# Patient Record
Sex: Male | Born: 1960 | Race: Black or African American | Hispanic: No | Marital: Single | Smoking: Current every day smoker
Health system: Southern US, Community
[De-identification: ages and names within clinical notes are randomized; demographics above are authoritative.]

## PROBLEM LIST (undated history)

## (undated) DIAGNOSIS — E785 Hyperlipidemia, unspecified: Secondary | ICD-10-CM

## (undated) DIAGNOSIS — J449 Chronic obstructive pulmonary disease, unspecified: Secondary | ICD-10-CM

## (undated) DIAGNOSIS — E119 Type 2 diabetes mellitus without complications: Secondary | ICD-10-CM

## (undated) HISTORY — DX: Chronic obstructive pulmonary disease, unspecified: J44.9

## (undated) HISTORY — DX: Type 2 diabetes mellitus without complications: E11.9

## (undated) HISTORY — DX: Hyperlipidemia, unspecified: E78.5

---

## 1998-09-10 ENCOUNTER — Ambulatory Visit (HOSPITAL_COMMUNITY): Admission: RE | Admit: 1998-09-10 | Discharge: 1998-09-10 | Payer: Self-pay | Admitting: Gastroenterology

## 1998-09-10 ENCOUNTER — Encounter: Payer: Self-pay | Admitting: Gastroenterology

## 1999-05-06 HISTORY — PX: HERNIA REPAIR: SHX51

## 1999-05-28 ENCOUNTER — Emergency Department (HOSPITAL_COMMUNITY): Admission: EM | Admit: 1999-05-28 | Discharge: 1999-05-28 | Payer: Self-pay | Admitting: Emergency Medicine

## 1999-05-28 ENCOUNTER — Encounter: Payer: Self-pay | Admitting: Emergency Medicine

## 1999-07-16 ENCOUNTER — Ambulatory Visit (HOSPITAL_COMMUNITY): Admission: RE | Admit: 1999-07-16 | Discharge: 1999-07-16 | Payer: Self-pay | Admitting: Gastroenterology

## 2000-01-10 ENCOUNTER — Emergency Department (HOSPITAL_COMMUNITY): Admission: EM | Admit: 2000-01-10 | Discharge: 2000-01-10 | Payer: Self-pay | Admitting: Emergency Medicine

## 2000-01-17 ENCOUNTER — Emergency Department (HOSPITAL_COMMUNITY): Admission: EM | Admit: 2000-01-17 | Discharge: 2000-01-17 | Payer: Self-pay | Admitting: Emergency Medicine

## 2000-01-24 ENCOUNTER — Ambulatory Visit (HOSPITAL_COMMUNITY): Admission: RE | Admit: 2000-01-24 | Discharge: 2000-01-24 | Payer: Self-pay | Admitting: *Deleted

## 2001-04-25 ENCOUNTER — Encounter: Payer: Self-pay | Admitting: Emergency Medicine

## 2001-04-25 ENCOUNTER — Emergency Department (HOSPITAL_COMMUNITY): Admission: EM | Admit: 2001-04-25 | Discharge: 2001-04-25 | Payer: Self-pay | Admitting: Emergency Medicine

## 2001-05-08 ENCOUNTER — Encounter: Payer: Self-pay | Admitting: Emergency Medicine

## 2001-05-08 ENCOUNTER — Emergency Department (HOSPITAL_COMMUNITY): Admission: EM | Admit: 2001-05-08 | Discharge: 2001-05-08 | Payer: Self-pay | Admitting: Emergency Medicine

## 2002-03-18 ENCOUNTER — Inpatient Hospital Stay (HOSPITAL_COMMUNITY): Admission: EM | Admit: 2002-03-18 | Discharge: 2002-03-22 | Payer: Self-pay | Admitting: Psychiatry

## 2003-07-21 ENCOUNTER — Emergency Department (HOSPITAL_COMMUNITY): Admission: EM | Admit: 2003-07-21 | Discharge: 2003-07-21 | Payer: Self-pay | Admitting: Emergency Medicine

## 2004-10-21 ENCOUNTER — Encounter: Admission: RE | Admit: 2004-10-21 | Discharge: 2004-10-21 | Payer: Self-pay | Admitting: Occupational Medicine

## 2004-10-21 IMAGING — CR DG CHEST 2V
2 series · 2 of 2 positions shown · non-contrast
Comparison: None. 
 The heart size is normal.  There is no heart failure, infiltrate or effusion.

CLINICAL DATA: Pre-employment physical.
 CHEST - 2 VIEW:

[view not recorded (1 of 2)]
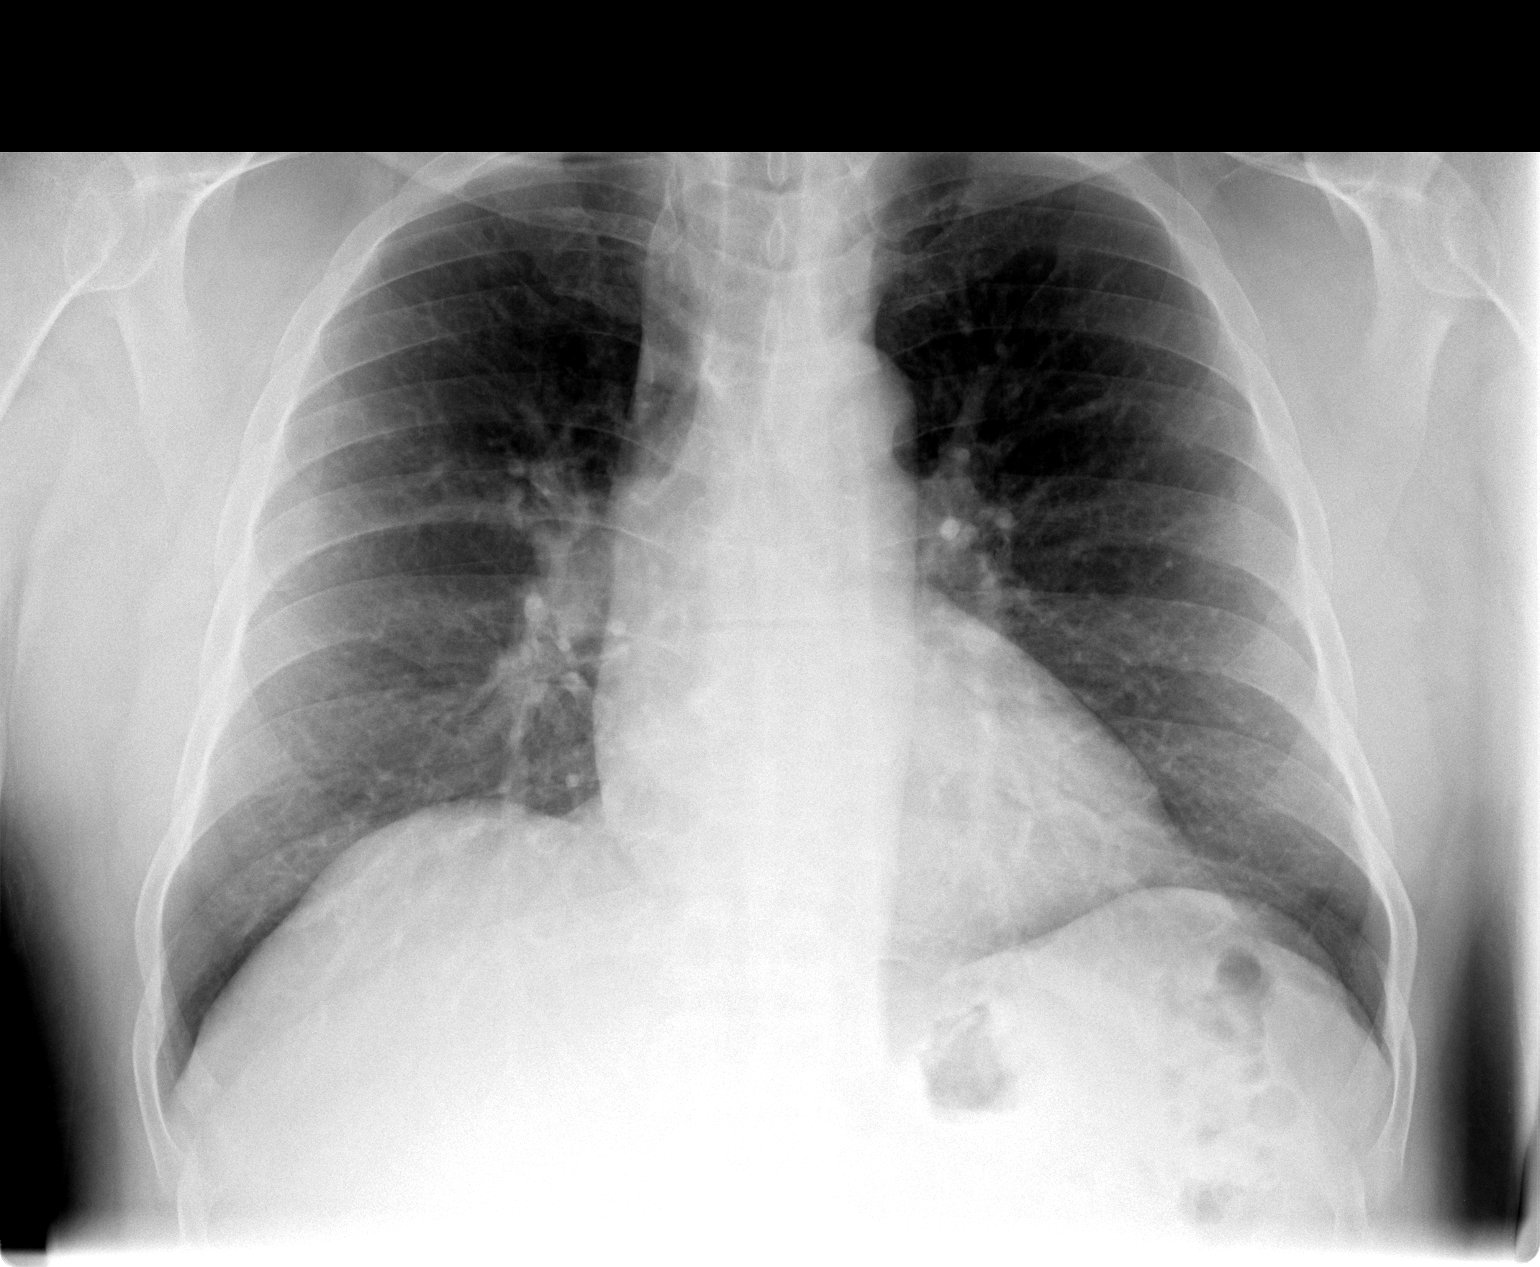

[view not recorded (2 of 2)]
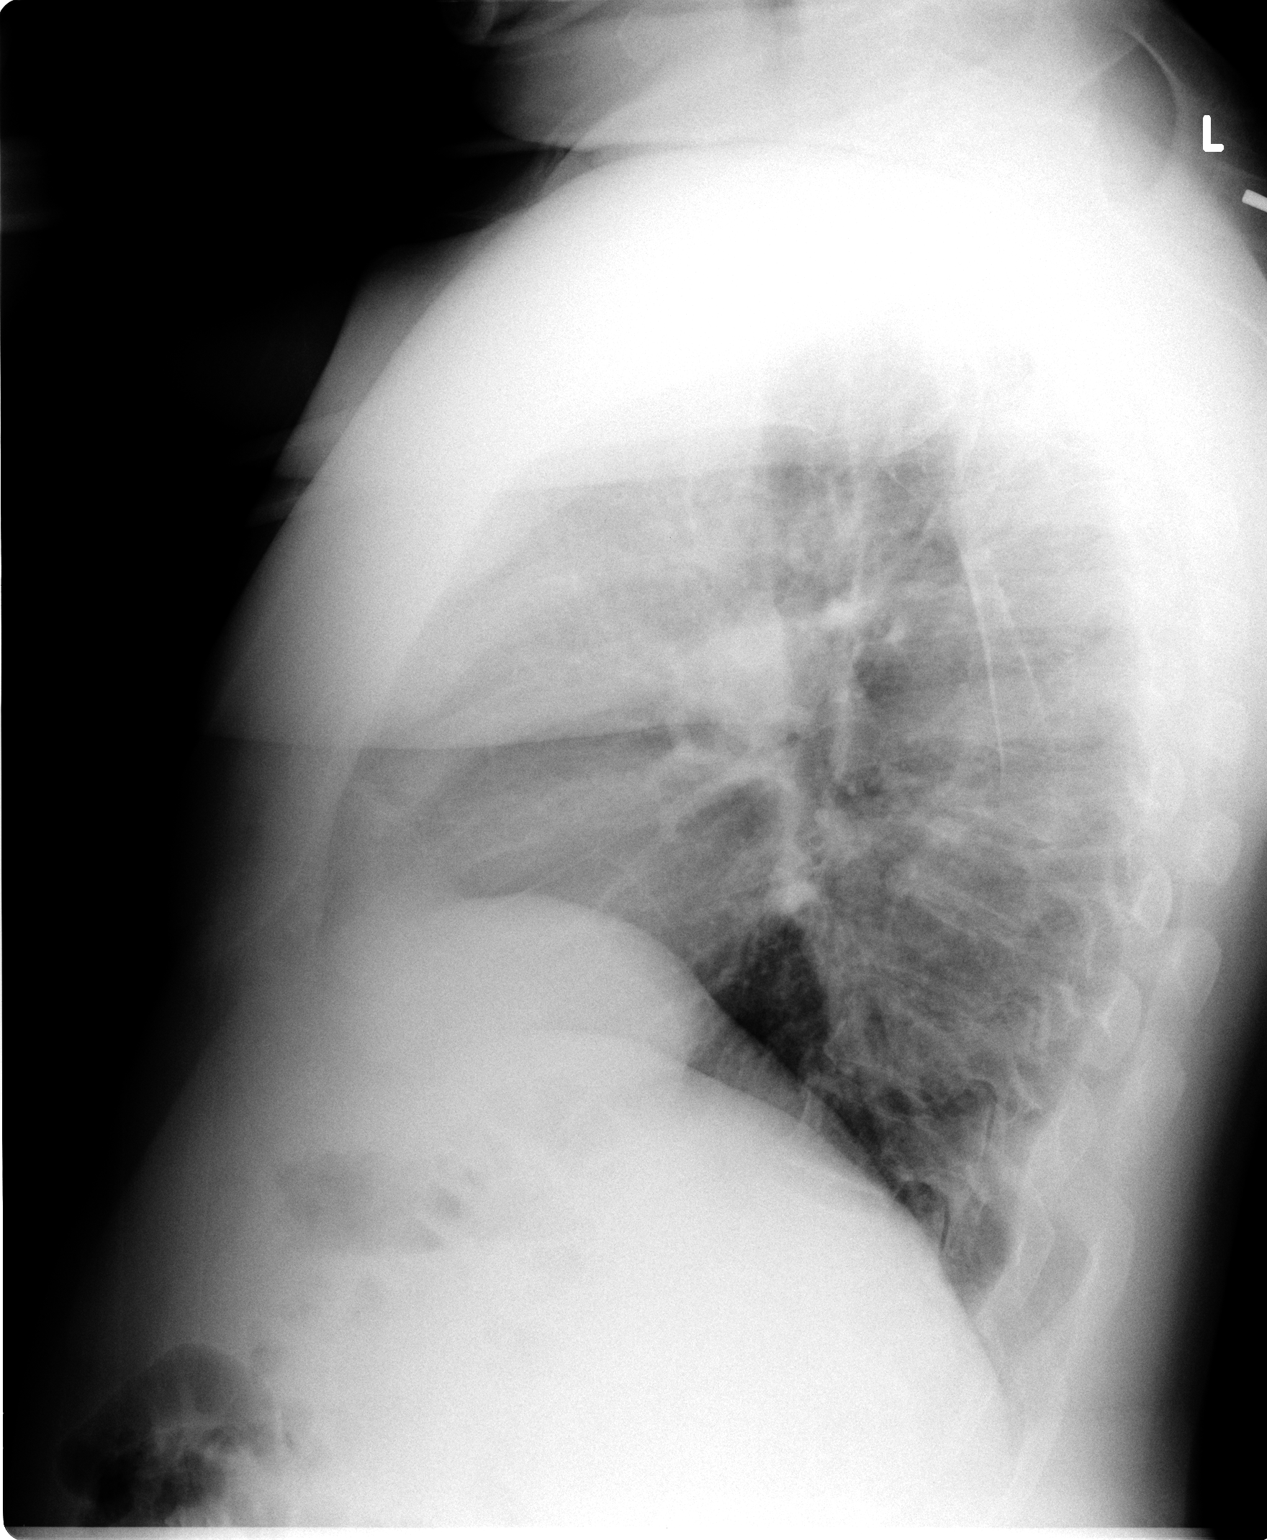

[2 of 2 positions shown; findings below may reference images not displayed]

IMPRESSION: No active disease.

## 2004-12-18 ENCOUNTER — Emergency Department (HOSPITAL_COMMUNITY): Admission: EM | Admit: 2004-12-18 | Discharge: 2004-12-18 | Payer: Self-pay | Admitting: Emergency Medicine

## 2006-11-14 ENCOUNTER — Emergency Department (HOSPITAL_COMMUNITY): Admission: EM | Admit: 2006-11-14 | Discharge: 2006-11-15 | Payer: Self-pay | Admitting: Emergency Medicine

## 2007-10-11 ENCOUNTER — Emergency Department (HOSPITAL_COMMUNITY): Admission: EM | Admit: 2007-10-11 | Discharge: 2007-10-11 | Payer: Self-pay | Admitting: Emergency Medicine

## 2008-09-20 ENCOUNTER — Emergency Department (HOSPITAL_COMMUNITY): Admission: EM | Admit: 2008-09-20 | Discharge: 2008-09-20 | Payer: Self-pay | Admitting: Emergency Medicine

## 2008-09-21 ENCOUNTER — Emergency Department (HOSPITAL_COMMUNITY): Admission: EM | Admit: 2008-09-21 | Discharge: 2008-09-21 | Payer: Self-pay | Admitting: Emergency Medicine

## 2010-08-13 LAB — POCT I-STAT, CHEM 8
BUN: 23 mg/dL (ref 6–23)
Calcium, Ion: 1.16 mmol/L (ref 1.12–1.32)
Chloride: 105 mEq/L (ref 96–112)
Creatinine, Ser: 1.1 mg/dL (ref 0.4–1.5)
Glucose, Bld: 111 mg/dL — ABNORMAL HIGH (ref 70–99)
HCT: 48 % (ref 39.0–52.0)
Hemoglobin: 16.3 g/dL (ref 13.0–17.0)
Potassium: 3 mEq/L — ABNORMAL LOW (ref 3.5–5.1)
Sodium: 139 mEq/L (ref 135–145)
TCO2: 26 mmol/L (ref 0–100)

## 2010-09-20 NOTE — Op Note (Signed)
[facility]  Patient:    Robert Strickland, Robert Strickland                        MRN: [id number] Proc. Date: 01/24/00 Adm. Date:  [id number] Attending:  Stephenie Acres                           Operative Report  PREOPERATIVE DIAGNOSIS:  Left inguinal hernia.  POSTOPERATIVE DIAGNOSIS:  Left inguinal hernia.  OPERATION:  Left inguinal hernia repair with mesh.  SURGEON:  Catalina Lunger, M.D.  ANESTHESIA:  General.  DESCRIPTION OF PROCEDURE:  The patient was taken to the operating room and placed in a supine position.  After adequate anesthesia was induced, the abdomen was prepped and draped in normal sterile fashion.  Using an oblique incision over the inguinal region, I dissected down to the external oblique fascia.  This was opened along its fibers.  Ilioinguinal nerve was retracted medially.  Spermatic cord was encircled with Penrose drain at the pubic tubercle.  Direct hernia sac was dissected off of the cord and reduced into the abdominal cavity.  The smaller exit incision was made in transversalis fascia.  The defect was closed by approximating transversalis fascia to the shelving edge of Coopers ligament using interrupted #1 Surgilons.  A piece of 3 x 6 inch onlay mesh was then placed over the repair, tacked medially to the pubic tubercle, superiorly to the transversalis fascia, inferiorly to Coopers ligament, split, and brought out around the cord.  A new internal ring was fashioned with the mesh.  It allowed only the tip of a Kelly clamp next to it. The tail of the mesh was also tacked down.  The fascia was then closed with a running 3-0 Vicryl.  All tissues were injected using Marcaine.  Skin was closed with staples.  The patient tolerated the procedure well and went to PACU in good condition. DD:  01/24/00 TD:  01/25/00 Job: [id number]

## 2010-09-20 NOTE — H&P (Signed)
NAME:  Robert Strickland, Robert Strickland                           ACCOUNT NO.:  [id number]   MEDICAL RECORD NO.:  [id number]                   PATIENT TYPE:  IPS   LOCATION:  0302                                 FACILITY:  [facility]   PHYSICIAN:  Jeanice Lim, M.D.              DATE OF BIRTH:  April 24, 1961   DATE OF ADMISSION:  03/18/2002  DATE OF DISCHARGE:                         PSYCHIATRIC ADMISSION ASSESSMENT   IDENTIFYING INFORMATION:  The patient is a 50 year old single African-  American male voluntarily admitted on March 18, 2002.   HISTORY OF PRESENT ILLNESS:  The patient presents with a history of alcohol  and drug use.  The patient states he needs help.  He feels very guilty over  relapsing on alcohol and drugs and experiencing evil spirits telling him to  hurt himself and, at that time, it was imperative that he get help.  He has  been having stressors with his past girlfriend and her children.  He states  he is unable to help them.  Children were disrespectful.  Girlfriend's  daughter had gotten herself pregnant.  He denies any current suicidal or  homicidal ideation or psychosis.  He is motivated to get his life back  together.  He feels very depressed but no suicidal ideation.  States his  sleep and appetite have been satisfactory.   PAST PSYCHIATRIC HISTORY:  First hospitalization to [facility].  No other psychiatric admissions.  No outpatient treatment.  He was  detoxed at [facility] in March of 2003.  His longest history of sobriety has been  five months.  No suicide attempts.   SOCIAL HISTORY:  This is a 50 year old single African-American male with two  children, ages 70 and 70.  Children are with their mother.  He works at  [employer].  No legal problems.  Completed the 10th grade.   FAMILY HISTORY:  States his son has ADD.   ALCOHOL/DRUG HISTORY:  The patient smokes.  He has been drinking four 40-  ounce beers per day.  States his drinking has been escalating.  He  drinks  sometimes in the morning.  No blackouts.  No history of seizures.  Last  drink was on March 17, 2002.  Also has been using crack cocaine for the  past two years with last use on Thursday prior to this admission.   PRIMARY CARE PHYSICIAN:  Dr. Randa Evens in [place].   MEDICAL PROBLEMS:  Hepatitis C was diagnosed in 1999.  He states he took  experimental treatment and has not had any problems since.   MEDICATIONS:  None.   ALLERGIES:  No known allergies.   PHYSICAL EXAMINATION:  Performed at [facility].  The  patient appears without any complaints.  He is well-nourished.   LABORATORY DATA:  Hemoglobin 19, hematocrit 57.  CMET is within normal  limits.  Troponin level is 0.02.  Urine drug screen was positive  for  cocaine.  Alcohol level less than 5.   MENTAL STATUS EXAM:  He is an alert, oriented, casually dressed male with  fair eye contact.  Speech is fair.  Mood is guilty, depressed.  Affect is  flat and tearful.  Thought processes are endorsing visual hallucinations  prior to this admission.  No auditory hallucinations.  No paranoia.  No  suicidal or homicidal ideation.  Cognitive function intact.  Memory is fair.  Judgment and insight are fair.  Poor impulse control.   DIAGNOSES:   AXIS I:  1. Major depression, severe.  2. Alcohol abuse.  3. Cocaine abuse.   AXIS II:  Deferred.   AXIS III:  None.   AXIS IV:  Problems with primary support group and other psychosocial  problems.   AXIS V:  Current 25; this past year 38.   PLAN:  Voluntary admission for depression, suicidal ideation and alcohol  dependence.  Contract for safety.  Check every 15 minutes.  Will initiate a  low dose Librium to detox safely.  Have Symmetrel for cravings.  Motrin will  be available for pain.  Will initiate Celexa for depressive symptoms.  Increase coping skills by attending groups.  Remain alcohol and drug-free.  Follow up with mental health.  To be  medication-compliant, which was  discussed with the patient.   TENTATIVE LENGTH OF STAY:  Four to five days.     Landry Corporal, N.P.                       Jeanice Lim, M.D.    JO/MEDQ  D:  03/22/2002  T:  03/22/2002  Job:  [id number]

## 2010-09-20 NOTE — Discharge Summary (Signed)
NAME:  Robert Strickland, Robert Strickland                           ACCOUNT NO.:  [id number]   MEDICAL RECORD NO.:  [id number]                   PATIENT TYPE:  IPS   LOCATION:  0302                                 [facility]ACILITY[facility]  [facility]   PHYSICIAN:  Jeanice Lim, M.D.              DATE OF BIRTH:  January 18, 1961   DATE OF ADMISSION:  03/18/2002  DATE OF DISCHARGE:  03/22/2002                                 DISCHARGE SUMMARY   IDENTIFYING DATA:  This is a 50 year old African-American male, voluntarily  admitted, presenting with a history of alcohol and drug use, feeling guilty  over relapsing on alcohol and drugs.  Experiencing evil spirits telling him  to hurt himself.  His girlfriend's daughter had become pregnant and he is  motivated to get his life back together.  Patient had be detoxed at [facility] in  the past.   MEDICATIONS:  None.   ALLERGIES:  No known drug allergies.   PHYSICAL EXAMINATION:  Performed [place].  Essentially within normal  limits.  Neurologically nonfocal.   ROUTINE ADMISSION LABORATORIES:  Hematology panel within normal limits.  CBC  within normal limits.  Troponin level 0.02, urine drug screen positive for  cocaine, alcohol level less than 5.   MENTAL STATUS EXAM:  Alert, oriented, casually dressed male, fair eye  contact.  Speech clear.  Mood guilty, depressed.  Affect flat, tearful.  Thought processes goal directed.  Reporting visual hallucinations prior to  admission.  No paranoia.  No dangerous ideation.  Cognitively intact.  Judgment and insight limited.   ADMISSION DIAGNOSES:   AXIS I:  1. Major depression, severe.  2. Alcohol dependency.  3. Cocaine abuse.   AXIS II:  None.   AXIS III:  None.   AXIS IV:  Moderate problems primarily support group and other psychosocial  issues.   AXIS V:  25/65   HOSPITAL COURSE:  Patient was admitted with routine p.o. medications.  Was  started on Symmetrel for cocaine cravings, Celexa targeting depressive  symptoms, placed on  a low dose Librium protocol for safe withdrawal with  monitoring.  Seroquel is added to restore sleep and target agitation.  Aftercare planning was done.  A family meeting was held.  Patient reported a  positive response to clinical intervention.   CONDITION ON DISCHARGE:  Improved.  Mood was more euthymic.  Affect  brighter.  Thought processes goal directed.  Thought content negative for  dangerous ideation and psychotic symptoms.  Patient denied any acute  withdrawal symptoms or cravings.   DISCHARGE MEDICATIONS:  Patient was discharged on:  1. Celexa 20 mg q.a.m.  2. Seroquel 200 mg q.h.s.  3. Ambien 10 mg q.h.s. p.r.n.  4. Symmetrel 100 mg b.i.d.   FOLLOW UP:  Patient was to follow up at [facility]  on 11/20 at 7 PM.   DISCHARGE DIAGNOSES:   AXIS I:  1. Major  depression, severe.  2. Alcohol dependency.  3. Cocaine abuse.   AXIS II:  None.   AXIS III:  None.   AXIS IV:  Moderate problems primarily support group and other psychosocial  issues.   AXIS V:  GAF on discharge was 55.                                                  Jeanice Lim, M.D.    JEM/MEDQ  D:  03/30/2002  T:  03/31/2002  Job:  [id number]

## 2010-09-20 NOTE — Procedures (Signed)
[place]  Patient:    Robert Strickland, Robert Strickland                        MRN: [id number] Proc. Date: 07/16/99 Adm. Date:  [id number] Attending:  Orland Mustard CC:         Moshe Salisbury. Audria Nine, M.D. at [facility]                           Procedure Report  PROCEDURE:  Colonoscopy.  ENDOSCOPIST:  Llana Aliment. Edwards, M.D.  MEDICATIONS:  Fentanyl 75 mcg and Versed 6 mg IV.  SCOPE:  Olympus adult video colonoscope.  INDICATIONS:  Rectal bleeding.  DESCRIPTION OF PROCEDURE:  The patient had been explained to the patient and consent obtained.  With the patient in the left lateral decubitus position, the  Olympus adult video colonoscope was inserted and advanced under direct visualization.  _________ and were able to advance to the cecum without difficulty.  The scope withdrawn.  The cecum, ascending colon, hepatic flexure,  transverse colon, splenic flexure, and descending colon were seen well.  The sigmoid was also seen well with no polyps.  No diverticular disease.  Internal hemorrhoids seen in the rectum upon removal of the scope.  The scope was withdrawn. The patient tolerated the procedure well.  ASSESSMENT:  Large internal hemorrhoids, otherwise negative colonoscopy.  PLAN:  Will give hemorrhoid sheet, fiber supplement, and see back in my office n two or three months to discuss his hepatitis C further. DD:  07/16/99 TD:  07/16/99 Job: [id number]

## 2011-01-31 DIAGNOSIS — Z800 Family history of malignant neoplasm of digestive organs: Secondary | ICD-10-CM | POA: Insufficient documentation

## 2011-02-05 DIAGNOSIS — N529 Male erectile dysfunction, unspecified: Secondary | ICD-10-CM | POA: Insufficient documentation

## 2011-02-18 LAB — BASIC METABOLIC PANEL
CO2: 25
Calcium: 9.1
Creatinine, Ser: 0.9
GFR calc Af Amer: >60
GFR calc non Af Amer: >60
Sodium: 138

## 2011-02-18 LAB — DIFFERENTIAL
Basophils Relative: 0
Lymphocytes Relative: 44
Lymphs Abs: 2.9
Monocytes Absolute: 0.5
Monocytes Relative: 8
Neutro Abs: 2.9
Neutrophils Relative %: 45

## 2011-02-18 LAB — CBC
Hemoglobin: 14.7
MCHC: 34.9
RBC: 4.72
WBC: 6.5

## 2015-11-14 DIAGNOSIS — I10 Essential (primary) hypertension: Secondary | ICD-10-CM | POA: Insufficient documentation

## 2016-06-06 DIAGNOSIS — E1122 Type 2 diabetes mellitus with diabetic chronic kidney disease: Secondary | ICD-10-CM | POA: Insufficient documentation

## 2016-08-29 DIAGNOSIS — B182 Chronic viral hepatitis C: Secondary | ICD-10-CM | POA: Insufficient documentation

## 2016-09-18 DIAGNOSIS — G8929 Other chronic pain: Secondary | ICD-10-CM | POA: Insufficient documentation

## 2017-08-19 DIAGNOSIS — E669 Obesity, unspecified: Secondary | ICD-10-CM | POA: Insufficient documentation

## 2017-08-19 DIAGNOSIS — Z720 Tobacco use: Secondary | ICD-10-CM | POA: Insufficient documentation

## 2017-08-19 DIAGNOSIS — R7303 Prediabetes: Secondary | ICD-10-CM | POA: Insufficient documentation

## 2017-09-21 DIAGNOSIS — E782 Mixed hyperlipidemia: Secondary | ICD-10-CM | POA: Insufficient documentation

## 2018-12-31 DIAGNOSIS — N182 Chronic kidney disease, stage 2 (mild): Secondary | ICD-10-CM | POA: Insufficient documentation

## 2019-03-15 DIAGNOSIS — J410 Simple chronic bronchitis: Secondary | ICD-10-CM | POA: Insufficient documentation

## 2020-09-18 ENCOUNTER — Other Ambulatory Visit (HOSPITAL_BASED_OUTPATIENT_CLINIC_OR_DEPARTMENT_OTHER): Payer: Self-pay | Admitting: Family Medicine

## 2020-09-18 DIAGNOSIS — J449 Chronic obstructive pulmonary disease, unspecified: Secondary | ICD-10-CM

## 2020-10-02 ENCOUNTER — Ambulatory Visit (HOSPITAL_BASED_OUTPATIENT_CLINIC_OR_DEPARTMENT_OTHER): Payer: 59

## 2020-10-08 ENCOUNTER — Ambulatory Visit (HOSPITAL_BASED_OUTPATIENT_CLINIC_OR_DEPARTMENT_OTHER): Payer: 59

## 2020-10-22 ENCOUNTER — Ambulatory Visit (HOSPITAL_BASED_OUTPATIENT_CLINIC_OR_DEPARTMENT_OTHER): Payer: 59

## 2021-05-13 ENCOUNTER — Ambulatory Visit (INDEPENDENT_AMBULATORY_CARE_PROVIDER_SITE_OTHER): Payer: 59 | Admitting: Urology

## 2021-05-13 ENCOUNTER — Other Ambulatory Visit: Payer: Self-pay

## 2021-05-13 VITALS — BP 133/79 | HR 99 | Ht 65.0 in | Wt 266.0 lb

## 2021-05-13 DIAGNOSIS — R310 Gross hematuria: Secondary | ICD-10-CM | POA: Diagnosis not present

## 2021-05-13 NOTE — Progress Notes (Signed)
05/13/2021 3:02 PM   Robert Strickland 13-May-1960 [id number]  Referring provider: Si Gaul, [place],  [place] [id number]  Chief Complaint  Patient presents with   Hematuria    HPI: Robert Strickland is a 61 y.o. male referred for evaluation of hematuria  Presented to ED [place] at [facility] 04/20/2021 with onset of gross hematuria the day of presentation. Urine described as dark red without clots No flank, abdominal or pelvic pain Does have baseline voiding symptoms including frequency, urgency, urinary hesitancy and a weak urinary stream CT abdomen pelvis with/without contrast was performed which showed a 14 mm hypodense lesion of the right adrenal gland consistent with a small adenoma.  No renal calculi, hydronephrosis or renal mass identified.  There was a large right inguinal hernia containing a portion of the bladder. Hematuria has resolved No anticoagulant medication  PMH:  Diabetes mellitus ([place])   Family history of colon cancer 01/31/2011   Hypercholesterolemia   Hypertension   Surgical History:  HERNIA REPAIR   Home Medications:  Allergies as of 05/13/2021       Reactions   Varenicline Other (See Comments)   Vivid dreams        Medication List        Accurate as of May 13, 2021  3:02 PM. If you have any questions, ask your nurse or doctor.          albuterol 108 (90 Base) MCG/ACT inhaler Commonly known as: VENTOLIN HFA INHALE 2 PUFFS INTO THE LUNGS EVERY 4 HOURS AS NEEDED FOR WHEEZING   buPROPion 150 MG 12 hr tablet Commonly known as: WELLBUTRIN SR TAKE 1 TABLET(150 MG) BY MOUTH DAILY FOR 3 DAYS THEN TAKE 1 TABLET(150 MG) BY MOUTH IN THE MORNING AND AT BEDTIME FOR 27 DAYS   cyclobenzaprine 10 MG tablet Commonly known as: FLEXERIL TAKE 1 TABLET(10 MG) BY MOUTH AT BEDTIME AS NEEDED   lisinopril 40 MG tablet Commonly known as: ZESTRIL Take 40 mg by mouth daily.   metFORMIN 500 MG  tablet Commonly known as: GLUCOPHAGE TAKE 1 TABLET(500 MG) BY MOUTH DAILY WITH BREAKFAST   Rybelsus 3 MG Tabs Generic drug: Semaglutide Take 1 tablet by mouth every morning.   sildenafil 20 MG tablet Commonly known as: REVATIO TAKE 1 TO 3 TABLETS DAILY AS NEEDED PRIOR TO SEXUAL INTERCOURSE        Allergies:  Allergies  Allergen Reactions   Varenicline Other (See Comments)    Vivid dreams    Family History: No family history on file.  Social History:  has no history on file for tobacco use, alcohol use, and drug use.   Physical Exam: BP 133/79    Pulse 99    Ht 5\' 5"  (1.651 m)    Wt 266 lb (120.7 kg)    BMI 44.26 kg/m   Constitutional:  Alert and oriented, No acute distress. HEENT: [place] AT, moist mucus membranes.  Trachea midline, no masses. Cardiovascular: No clubbing, cyanosis, or edema. Respiratory: Normal respiratory effort, no increased work of breathing. GI: Large right inguinal hernia GU: Unable to palpate prostate secondary to body habitus and discomfort Skin: No rashes, bruises or suspicious lesions. Neurologic: Grossly intact, no focal deficits, moving all 4 extremities. Psychiatric: Normal mood and affect.  Laboratory Data:  Urinalysis Negative rbc  Pertinent Imaging: CT images were unable to be reviewed   Assessment & Plan:    1. Gross hematuria AUA hematuria risk stratification:  High CT urogram was unremarkable He does have a right inguinal hernia containing a portion of bowel We discussed cystoscopy as the recommended lower tract evaluation for hematuria The procedure was discussed in detail and he did request Rx for Valium preprocedure.  He was informed he would need a driver.   2.  Right inguinal hernia Containing a portion of bowel Recommend general surgery evaluation    Abbie Sons, MD  [place] [address], [place], [place] [address] [phone]

## 2021-05-15 ENCOUNTER — Encounter: Payer: Self-pay | Admitting: Urology

## 2021-05-15 MED ORDER — DIAZEPAM 10 MG PO TABS: ORAL_TABLET | ORAL | 0 refills | Status: DC

## 2021-05-16 LAB — MICROSCOPIC EXAMINATION
Bacteria, UA: NONE SEEN
RBC, Urine: NONE SEEN /hpf (ref 0–2)

## 2021-05-16 LAB — URINALYSIS, COMPLETE
Bilirubin, UA: NEGATIVE
Glucose, UA: NEGATIVE
Ketones, UA: NEGATIVE
Leukocytes,UA: NEGATIVE
Nitrite, UA: NEGATIVE
Protein,UA: NEGATIVE
RBC, UA: NEGATIVE
Specific Gravity, UA: 1.015 (ref 1.005–1.030)
Urobilinogen, Ur: 0.2 mg/dL (ref 0.2–1.0)
pH, UA: 7 (ref 5.0–7.5)

## 2021-06-13 ENCOUNTER — Other Ambulatory Visit: Payer: Self-pay

## 2021-06-13 ENCOUNTER — Ambulatory Visit (INDEPENDENT_AMBULATORY_CARE_PROVIDER_SITE_OTHER): Payer: Managed Care, Other (non HMO) | Admitting: Urology

## 2021-06-13 VITALS — BP 114/75 | HR 97 | Ht 65.0 in | Wt 260.0 lb

## 2021-06-13 DIAGNOSIS — R310 Gross hematuria: Secondary | ICD-10-CM

## 2021-06-13 LAB — URINALYSIS, COMPLETE
Bilirubin, UA: NEGATIVE
Glucose, UA: NEGATIVE
Ketones, UA: NEGATIVE
Leukocytes,UA: NEGATIVE
Nitrite, UA: NEGATIVE
Protein,UA: NEGATIVE
RBC, UA: NEGATIVE
Specific Gravity, UA: 1.025 (ref 1.005–1.030)
Urobilinogen, Ur: 0.2 mg/dL (ref 0.2–1.0)
pH, UA: 5.5 (ref 5.0–7.5)

## 2021-06-13 LAB — MICROSCOPIC EXAMINATION
Bacteria, UA: NONE SEEN
RBC: NONE SEEN /hpf (ref 0–2)

## 2021-06-13 NOTE — Progress Notes (Signed)
° °  06/13/21  CC:  Chief Complaint  Patient presents with   Cysto    HPI: Seen 05/13/2021 after an episode of gross hematuria.  CT urogram showed no upper tract abnormalities.  He did have a large right inguinal hernia containing a portion of the bladder.  Denies recurrent hematuria.  UA today shows no abnormalities  See rooming tab for vitals NED. A&Ox3.   No respiratory distress   Abd soft, NT, ND Normal phallus with bilateral descended testicles  Cystoscopy Procedure Note  Patient identification was confirmed, informed consent was obtained, and patient was prepped using Betadine solution.  Lidocaine jelly was administered per urethral meatus.     Pre-Procedure: - Inspection reveals a normal caliber urethral meatus.  Procedure: The flexible cystoscope was introduced without difficulty - No urethral strictures/lesions are present. -  Prominent lateral lobe enlargement with hypervascularity/friability   - Elevated bladder neck - Bilateral ureteral orifices identified - Bladder mucosa  reveals no ulcers, tumors, or lesions.  The portion of the bladder contained within the right inguinal hernia was easily examined with the cystoscope and no mucosal abnormalities noted - No bladder stones - No trabeculation  Retroflexion shows backbleeding from the prostate with no gross abnormalities   Post-Procedure: - Patient tolerated the procedure well  Assessment/ Plan: Gross hematuria-resolved.  Most likely prostatic in origin No mucosal abnormalities on cystoscopy Prominent hypervascularity and friability prostatic urethra He does have a large hernia containing a portion of the bladder.  His PCP in [facility] has placed a surgery consult and they will contact that office regarding status    Abbie Sons, MD

## 2021-06-14 ENCOUNTER — Encounter: Payer: Self-pay | Admitting: Urology

## 2022-02-17 ENCOUNTER — Ambulatory Visit: Payer: 59 | Admitting: Internal Medicine

## 2022-05-09 ENCOUNTER — Ambulatory Visit: Payer: Self-pay | Admitting: Medical

## 2022-05-27 ENCOUNTER — Other Ambulatory Visit: Payer: Self-pay

## 2022-05-27 ENCOUNTER — Ambulatory Visit: Payer: 59 | Admitting: Medical

## 2022-05-27 VITALS — BP 120/70 | HR 90 | Resp 18 | Ht 65.0 in | Wt 284.6 lb

## 2022-05-27 DIAGNOSIS — Z6841 Body Mass Index (BMI) 40.0 and over, adult: Secondary | ICD-10-CM

## 2022-05-27 DIAGNOSIS — I10 Essential (primary) hypertension: Secondary | ICD-10-CM

## 2022-05-27 DIAGNOSIS — Z23 Encounter for immunization: Secondary | ICD-10-CM

## 2022-05-27 DIAGNOSIS — J4540 Moderate persistent asthma, uncomplicated: Secondary | ICD-10-CM

## 2022-05-27 DIAGNOSIS — N529 Male erectile dysfunction, unspecified: Secondary | ICD-10-CM

## 2022-05-27 DIAGNOSIS — E1169 Type 2 diabetes mellitus with other specified complication: Secondary | ICD-10-CM | POA: Diagnosis not present

## 2022-05-27 DIAGNOSIS — E119 Type 2 diabetes mellitus without complications: Secondary | ICD-10-CM

## 2022-05-27 DIAGNOSIS — E785 Hyperlipidemia, unspecified: Secondary | ICD-10-CM

## 2022-05-27 DIAGNOSIS — F17200 Nicotine dependence, unspecified, uncomplicated: Secondary | ICD-10-CM | POA: Diagnosis not present

## 2022-05-27 DIAGNOSIS — Z1211 Encounter for screening for malignant neoplasm of colon: Secondary | ICD-10-CM

## 2022-05-27 MED ORDER — BUPROPION HCL ER (XL) 150 MG PO TB24: 150.0000 mg | ORAL_TABLET | Freq: Every day | ORAL | 3 refills | Status: AC

## 2022-05-27 MED ORDER — BUDESONIDE-FORMOTEROL FUMARATE 160-4.5 MCG/ACT IN AERO: 2.0000 | INHALATION_SPRAY | Freq: Two times a day (BID) | RESPIRATORY_TRACT | 12 refills | Status: AC

## 2022-05-27 MED ORDER — BUDESONIDE-FORMOTEROL FUMARATE 160-4.5 MCG/ACT IN AERO: 2.0000 | INHALATION_SPRAY | Freq: Two times a day (BID) | RESPIRATORY_TRACT | 12 refills | Status: DC

## 2022-05-27 MED ORDER — SILDENAFIL CITRATE 100 MG PO TABS: 50.0000 mg | ORAL_TABLET | Freq: Every day | ORAL | 3 refills | Status: AC | PRN

## 2022-05-27 NOTE — Progress Notes (Signed)
Subjective:    Patient ID: Robert Strickland, male    DOB: [birth year], 62 y.o.   MRN: [id number]  HPI   Pt in for first time.  Pt works Programmer, systems. Works for [employer]. Pt was walking regularly until it got cold. States eats salad at lunch. Smoker, no alcohol use.    Pt is diabetic- last a1 was 6.3 months ago. He is only on meftormin. He is on 500 mg once a day.   Smoker- smoked 45 years 1/2 pack a day. When used wellbutrin in past smoked less.   Obesity- bmi of 47.36. No hx of pancreatitis. No fh of thyromedullary cancer.  Obesity.   Htn- bp controlled. Lisinopril 40 mg daily.  Hx of asthma- he has albuterol. At work. He uses 1-2 times daily. He has symbicort in the past but ran out.  ED- pt use revatio. He gets that at [facility]. No history of chest pain. No nitro use.   Pt states he is due to have colonoscopy-  was due in December but lost insurance.    Review of Systems  Constitutional:  Negative for chills, fatigue and fever.  Respiratory:  Negative for cough, chest tightness, shortness of breath and wheezing.   Cardiovascular:  Negative for chest pain and palpitations.  Gastrointestinal:  Negative for abdominal pain.  Genitourinary:  Negative for dysuria and flank pain.  Musculoskeletal:  Negative for back pain.  Skin:  Negative for rash.  Neurological:  Negative for dizziness, speech difficulty, weakness, numbness and headaches.  Hematological:  Negative for adenopathy. Does not bruise/bleed easily.  Psychiatric/Behavioral:  Negative for behavioral problems and decreased concentration.     No past medical history on file.   Social History   Socioeconomic History   Marital status: Single    Spouse name: Not on file   Number of children: Not on file   Years of education: Not on file   Highest education level: Not on file  Occupational History   Not on file  Tobacco Use   Smoking status: Not on file   Smokeless tobacco: Not on file  Substance and Sexual  Activity   Alcohol use: Not on file   Drug use: Not on file   Sexual activity: Not on file  Other Topics Concern   Not on file  Social History Narrative   Not on file   Social Determinants of Health   Financial Resource Strain: Not on file  Food Insecurity: Not on file  Transportation Needs: Not on file  Physical Activity: Not on file  Stress: Not on file  Social Connections: Not on file  Intimate Partner Violence: Not on file    No past surgical history on file.  No family history on file.  Allergies  Allergen Reactions   Bee Venom Swelling    Possibly anaphylaxis per pt Possibly anaphylaxis per pt    Varenicline Other (See Comments)    Vivid dreams    Current Outpatient Medications on File Prior to Visit  Medication Sig Dispense Refill   albuterol (VENTOLIN HFA) 108 (90 Base) MCG/ACT inhaler INHALE 2 PUFFS INTO THE LUNGS EVERY 4 HOURS AS NEEDED FOR WHEEZING     buPROPion (WELLBUTRIN SR) 150 MG 12 hr tablet TAKE 1 TABLET(150 MG) BY MOUTH DAILY FOR 3 DAYS THEN TAKE 1 TABLET(150 MG) BY MOUTH IN THE MORNING AND AT BEDTIME FOR 27 DAYS     cyclobenzaprine (FLEXERIL) 10 MG tablet TAKE 1 TABLET(10 MG) BY MOUTH AT BEDTIME  AS NEEDED     diazepam (VALIUM) 10 MG tablet 1 tab po 30 min prior to procedure 1 tablet 0   lisinopril (ZESTRIL) 40 MG tablet Take 40 mg by mouth daily.     metFORMIN (GLUCOPHAGE) 500 MG tablet TAKE 1 TABLET(500 MG) BY MOUTH DAILY WITH BREAKFAST     sildenafil (REVATIO) 20 MG tablet TAKE 1 TO 3 TABLETS DAILY AS NEEDED PRIOR TO SEXUAL INTERCOURSE     diclofenac (VOLTAREN) 75 MG EC tablet Take by mouth 2 (two) times daily.     No current facility-administered medications on file prior to visit.    BP 120/70   Pulse 90   Resp 18   Ht 5\' 5"  (1.651 m)   Wt 284 lb 9.6 oz (129.1 kg)   SpO2 98%   BMI 47.36 kg/m        Objective:   Physical Exam  General Mental Status- Alert. General Appearance- Not in acute distress.   Skin General: Color- Normal  Color. Moisture- Normal Moisture.  Neck Carotid Arteries- Normal color. Moisture- Normal Moisture. No carotid bruits. No JVD.  Chest and Lung Exam Auscultation: Breath Sounds:-Normal.  Cardiovascular Auscultation:Rythm- Regular. Murmurs & Other Heart Sounds:Auscultation of the heart reveals- No Murmurs.  Abdomen Inspection:-Inspeection Normal. Palpation/Percussion:Note:No mass. Palpation and Percussion of the abdomen reveal- Non Tender, Non Distended + BS, no rebound or guarding.   Neurologic Cranial Nerve exam:- CN III-XII intact(No nystagmus), symmetric smile. Strength:- 5/5 equal and symmetric strength both upper and lower extremities.       Assessment & Plan:   Patient Instructions  1. Need for influenza vaccination  - Flu Vaccine QUAD 6+ mos PF IM (Fluarix Quad PF)  2. Encounter for screening colonoscopy Recommend call gi office number which is on summary - Ambulatory referral to Gastroenterology  3. Diabetes mellitus without complication ([place]) Continue metformin and low sugar diet.  - Comp Met (CMET); Future - Hemoglobin A1c; Future  4. Class 3 severe obesity without serious comorbidity with body mass index (BMI) of 45.0 to 49.9 in adult, unspecified obesity type ([place]) After review of labs might consider med called ozempic. But you express doubt on coverage cost.  5. Hypertension, unspecified type Lisinopril 40 mg daily.  . 6. Smoker Rx wellbutrin 150 xl daily.  7. Moderate persistent asthma without complication  For asthma use symbicort 2 inhalation twice daily. Use albuterol inhaler 2 inhalation every 4-6 hours sob or wheezing.  8. Hyperlipidemia associated with type 2 diabetes mellitus ([place]) For high cholesterol rx atorvastatin. - Lipid panel; Future   9. For ED rx viagra. Sent to [place].   Future cmp, lipid and A1c to be done fasting. Please get put on lab schedule.  Follow up one month or sooner if needed.     Mackie Pai, PA-C

## 2022-05-27 NOTE — Patient Instructions (Addendum)
1. Need for influenza vaccination  - Flu Vaccine QUAD 6+ mos PF IM (Fluarix Quad PF)  2. Encounter for screening colonoscopy Recommend call gi office number which is on summary - Ambulatory referral to Gastroenterology  3. Diabetes mellitus without complication ([place]) Continue metformin and low sugar diet.  - Comp Met (CMET); Future - Hemoglobin A1c; Future  4. Class 3 severe obesity without serious comorbidity with body mass index (BMI) of 45.0 to 49.9 in adult, unspecified obesity type ([place]) After review of labs might consider med called ozempic. But you express doubt on coverage cost.  5. Hypertension, unspecified type Lisinopril 40 mg daily.  . 6. Smoker Rx wellbutrin 150 xl daily.  7. Moderate persistent asthma without complication  For asthma use symbicort 2 inhalation twice daily. Use albuterol inhaler 2 inhalation every 4-6 hours sob or wheezing.  8. Hyperlipidemia associated with type 2 diabetes mellitus ([employer]) For high cholesterol rx atorvastatin. - Lipid panel; Future   9. For ED rx viagra. Sent to [place].   Future cmp, lipid and A1c to be done fasting. Please get put on lab schedule.  Follow up one month or sooner if needed.

## 2022-05-27 NOTE — Addendum Note (Signed)
Addended by: Anabel Halon on: 05/27/2022 03:51 PM   Modules accepted: Orders

## 2022-05-30 ENCOUNTER — Encounter: Payer: Self-pay | Admitting: Gastroenterology

## 2022-06-06 ENCOUNTER — Ambulatory Visit: Payer: Self-pay | Admitting: Medical

## 2022-06-10 ENCOUNTER — Encounter: Payer: Self-pay | Admitting: Gastroenterology

## 2022-06-10 ENCOUNTER — Ambulatory Visit (AMBULATORY_SURGERY_CENTER): Payer: 59 | Admitting: *Deleted

## 2022-06-10 VITALS — Ht 65.0 in | Wt 267.0 lb

## 2022-06-10 DIAGNOSIS — Z1211 Encounter for screening for malignant neoplasm of colon: Secondary | ICD-10-CM

## 2022-06-10 DIAGNOSIS — Z800 Family history of malignant neoplasm of digestive organs: Secondary | ICD-10-CM

## 2022-06-10 MED ORDER — PEG 3350-KCL-NA BICARB-NACL 420 G PO SOLR: 4000.0000 mL | Freq: Once | ORAL | 0 refills | Status: AC

## 2022-06-10 NOTE — Progress Notes (Signed)
No egg or soy allergy known to patient  No issues known to pt with past sedation with any surgeries or procedures Patient denies ever being told they had issues or difficulty with intubation  No FH of Malignant Hyperthermia Pt is not on diet pills Pt is not on  home 02  Pt is not on blood thinners  Pt denies issues with constipation  Pt is not on dialysis Pt denies any upcoming cardiac testing Pt encouraged to use to use Singlecare or Goodrx to reduce cost  Patient's chart reviewed by John Nulty CNRA prior to previsit and patient appropriate for the [facility].  Previsit completed and red dot placed by patient's name on their procedure day (on provider's schedule).  . Visit by phone Instructions reviewed with pt and pt states understanding. Instructed to review again prior to procedure. Pt states they will.  Instructions sent by mail 

## 2022-06-16 ENCOUNTER — Other Ambulatory Visit: Payer: 59

## 2022-07-02 ENCOUNTER — Encounter: Payer: 59 | Admitting: Gastroenterology

## 2022-07-04 ENCOUNTER — Ambulatory Visit (AMBULATORY_SURGERY_CENTER): Payer: 59

## 2022-07-04 VITALS — Ht 65.0 in | Wt 270.0 lb

## 2022-07-04 DIAGNOSIS — Z800 Family history of malignant neoplasm of digestive organs: Secondary | ICD-10-CM

## 2022-07-04 MED ORDER — PEG 3350-KCL-NA BICARB-NACL 420 G PO SOLR: 4000.0000 mL | Freq: Once | ORAL | 0 refills | Status: AC

## 2022-07-04 NOTE — Progress Notes (Signed)
No egg or soy allergy known to patient   No issues known to pt with past sedation with any surgeries or procedures  Patient denies ever being told they had issues or difficulty with intubation   No FH of Malignant Hyperthermia  Pt is not on diet pills  Pt is not on  home 02   Pt is not on blood thinners   Pt denies issues with constipation   No A fib or A flutter  Have any cardiac testing pending--no Pt instructed to use Singlecare.com or GoodRx for a price reduction on prep   

## 2022-07-09 ENCOUNTER — Encounter: Payer: Self-pay | Admitting: Gastroenterology

## 2022-07-24 ENCOUNTER — Encounter: Payer: 59 | Admitting: Gastroenterology

## 2022-07-28 ENCOUNTER — Ambulatory Visit: Payer: 59 | Admitting: Internal Medicine

## 2022-08-10 ENCOUNTER — Encounter: Payer: Self-pay | Admitting: Certified Registered Nurse Anesthetist

## 2022-08-11 ENCOUNTER — Ambulatory Visit (AMBULATORY_SURGERY_CENTER): Payer: 59 | Admitting: Gastroenterology

## 2022-08-11 ENCOUNTER — Encounter: Payer: Self-pay | Admitting: Gastroenterology

## 2022-08-11 VITALS — BP 111/80 | HR 91 | Temp 98.0°F | Resp 25 | Ht 65.0 in | Wt 270.0 lb

## 2022-08-11 DIAGNOSIS — Z800 Family history of malignant neoplasm of digestive organs: Secondary | ICD-10-CM | POA: Diagnosis not present

## 2022-08-11 DIAGNOSIS — Z1211 Encounter for screening for malignant neoplasm of colon: Secondary | ICD-10-CM

## 2022-08-11 MED ORDER — SODIUM CHLORIDE 0.9 % IV SOLN
500.0000 mL | Freq: Once | INTRAVENOUS | Status: DC
Start: 2022-08-11 — End: 2022-08-11

## 2022-08-11 NOTE — Patient Instructions (Signed)
**  Handout given on Diverticulosis and Hemorrhoids**   YOU HAD AN ENDOSCOPIC PROCEDURE TODAY AT THE [facility]:   Refer to the procedure report that was given to you for any specific questions about what was found during the examination.  If the procedure report does not answer your questions, please call your gastroenterologist to clarify.  If you requested that your care partner not be given the details of your procedure findings, then the procedure report has been included in a sealed envelope for you to review at your convenience later.  YOU SHOULD EXPECT: Some feelings of bloating in the abdomen. Passage of more gas than usual.  Walking can help get rid of the air that was put into your GI tract during the procedure and reduce the bloating. If you had a lower endoscopy (such as a colonoscopy or flexible sigmoidoscopy) you may notice spotting of blood in your stool or on the toilet paper. If you underwent a bowel prep for your procedure, you may not have a normal bowel movement for a few days.  Please Note:  You might notice some irritation and congestion in your nose or some drainage.  This is from the oxygen used during your procedure.  There is no need for concern and it should clear up in a day or so.  SYMPTOMS TO REPORT IMMEDIATELY:  Following lower endoscopy (colonoscopy or flexible sigmoidoscopy):  Excessive amounts of blood in the stool  Significant tenderness or worsening of abdominal pains  Swelling of the abdomen that is new, acute  Fever of 100F or higher   For urgent or emergent issues, a gastroenterologist can be reached at any hour by calling [phone]. Do not use MyChart messaging for urgent concerns.    DIET:  We do recommend a small meal at first, but then you may proceed to your regular diet.  Drink plenty of fluids but you should avoid alcoholic beverages for 24 hours.  ACTIVITY:  You should plan to take it easy for the rest of today and you should  NOT DRIVE or use heavy machinery until tomorrow (because of the sedation medicines used during the test).    FOLLOW UP:  Repeat colonoscopy in 1 year with Golytely prep.  Our staff will call the number listed on your records the next business day following your procedure.  We will call around 7:15- 8:00 am to check on you and address any questions or concerns that you may have regarding the information given to you following your procedure. If we do not reach you, we will leave a message.     If any biopsies were taken you will be contacted by phone or by letter within the next 1-3 weeks.  Please call us at [phone] if you have not heard about the biopsies in 3 weeks.    SIGNATURES/CONFIDENTIALITY: You and/or your care partner have signed paperwork which will be entered into your electronic medical record.  These signatures attest to the fact that that the information above on your After Visit Summary has been reviewed and is understood.  Full responsibility of the confidentiality of this discharge information lies with you and/or your care-partner.

## 2022-08-11 NOTE — Progress Notes (Signed)
History and Physical:  This patient presents for endoscopic testing for: Encounter Diagnosis  Name Primary?   Family history of colon cancer Yes    Mother CRC about age 62 Patient's last colonoscopy over 10 yrs ago.   Yeas of intermittent painless rectal bleeding  Patient is otherwise without complaints or active issues today.   Past Medical History: Past Medical History:  Diagnosis Date   COPD (chronic obstructive pulmonary disease)    Diabetes mellitus without complication    Hyperlipidemia      Past Surgical History: Past Surgical History:  Procedure Laterality Date   HERNIA REPAIR  2001    Allergies: Allergies  Allergen Reactions   Bee Venom Swelling    Possibly anaphylaxis per pt  Possibly anaphylaxis per pt Possibly anaphylaxis per pt    Possibly anaphylaxis per pt   Varenicline Other (See Comments)    Vivid dreams  Other Reaction(s): Other (See Comments)    Vivid dreams    Outpatient Meds: Current Outpatient Medications  Medication Sig Dispense Refill   albuterol (VENTOLIN HFA) 108 (90 Base) MCG/ACT inhaler INHALE 2 PUFFS INTO THE LUNGS EVERY 4 HOURS AS NEEDED FOR WHEEZING     atorvastatin (LIPITOR) 20 MG tablet Take 20 mg by mouth daily.     budesonide-formoterol (SYMBICORT) 160-4.5 MCG/ACT inhaler Inhale 2 puffs into the lungs 2 (two) times daily. 1 each 12   buPROPion (WELLBUTRIN XL) 150 MG 24 hr tablet Take 1 tablet (150 mg total) by mouth daily. 30 tablet 3   lisinopril (ZESTRIL) 40 MG tablet Take 40 mg by mouth daily.     metFORMIN (GLUCOPHAGE) 500 MG tablet TAKE 1 TABLET(500 MG) BY MOUTH DAILY WITH BREAKFAST     WIXELA INHUB 250-50 MCG/ACT AEPB Inhale into the lungs.     amoxicillin (AMOXIL) 500 MG capsule Take 500 mg by mouth every 6 (six) hours.     cyclobenzaprine (FLEXERIL) 10 MG tablet TAKE 1 TABLET(10 MG) BY MOUTH AT BEDTIME AS NEEDED     diclofenac (VOLTAREN) 75 MG EC tablet Take by mouth 2 (two) times daily.     ibuprofen (ADVIL) 800 MG  tablet Take 800 mg by mouth every 8 (eight) hours. (Patient not taking: Reported on 07/04/2022)     sildenafil (VIAGRA) 100 MG tablet Take 0.5-1 tablets (50-100 mg total) by mouth daily as needed for erectile dysfunction. 10 tablet 3   Current Facility-Administered Medications  Medication Dose Route Frequency Provider Last Rate Last Admin   0.9 %  sodium chloride infusion  500 mL Intravenous Once Danis, Starr Lake III, MD          ___________________________________________________________________ Objective   Exam:  BP (!) 137/94   Pulse 86   Temp 98 F (36.7 C) (Temporal)   Resp 15   Ht 5\' 5"  (1.651 m)   Wt 270 lb (122.5 kg)   SpO2 98%   BMI 44.93 kg/m   CV: regular , S1/S2 Resp: clear to auscultation bilaterally, normal RR and effort noted GI: soft, no tenderness, with active bowel sounds.   Assessment: Encounter Diagnosis  Name Primary?   Family history of colon cancer Yes     Plan: Colonoscopy  The benefits and risks of the planned procedure were described in detail with the patient or (when appropriate) their health care proxy.  Risks were outlined as including, but not limited to, bleeding, infection, perforation, adverse medication reaction leading to cardiac or pulmonary decompensation, pancreatitis (if ERCP).  The limitation of incomplete mucosal visualization  was also discussed.  No guarantees or warranties were given.    The patient is appropriate for an endoscopic procedure in the ambulatory setting.   - Amada Jupiter, MD

## 2022-08-11 NOTE — Op Note (Signed)
[facility] Patient Name: Robert Strickland Procedure Date: 08/11/2022 7:59 AM MRN: [id number] Endoscopist: Sherilyn Cooter L. Myrtie Neither , MD, [id number] Age: 62 Referring MD:  Date of Birth: 1961-03-24 Gender: Male Account #: [id number] Procedure:                Colonoscopy Indications:              Colon cancer screening in patient at increased                            risk: Colorectal cancer in mother Medicines:                Monitored Anesthesia Care Procedure:                Pre-Anesthesia Assessment:                           - Prior to the procedure, a History and Physical                            was performed, and patient medications and                            allergies were reviewed. The patient's tolerance of                            previous anesthesia was also reviewed. The risks                            and benefits of the procedure and the sedation                            options and risks were discussed with the patient.                            All questions were answered, and informed consent                            was obtained. Prior Anticoagulants: The patient has                            taken no anticoagulant or antiplatelet agents. ASA                            Grade Assessment: III - A patient with severe                            systemic disease. After reviewing the risks and                            benefits, the patient was deemed in satisfactory                            condition to undergo the procedure.  After obtaining informed consent, the colonoscope                            was passed under direct vision. Throughout the                            procedure, the patient's blood pressure, pulse, and                            oxygen saturations were monitored continuously. The                            CF [id number] #[id number] was introduced through the anus                            and advanced to the the  cecum, identified by                            appendiceal orifice and ileocecal valve. The                            colonoscopy was somewhat difficult due to poor                            bowel prep. Successful completion of the procedure                            was aided by lavage. The patient tolerated the                            procedure well. The quality of the bowel                            preparation was poor. The ileocecal valve,                            appendiceal orifice, and rectum were photographed. Scope In: 8:08:11 AM Scope Out: 8:19:41 AM Scope Withdrawal Time: 0 hours 8 minutes 54 seconds  Total Procedure Duration: 0 hours 11 minutes 30 seconds  Findings:                 The perianal and digital rectal examinations were                            normal.                           Repeat examination of right colon under NBI                            performed.                           Diverticula were found in the entire colon.  Internal hemorrhoids were found.                           An area of melanosis was found in the entire colon.                           There was scattered fibrous food debris throughout                            the colon that could not be completely cleared                            despite lavage.                           The exam was otherwise without abnormality on                            direct and retroflexion views. Complications:            No immediate complications. Estimated Blood Loss:     Estimated blood loss: none. Impression:               - Preparation of the colon was poor.                           - Diverticulosis in the entire examined colon.                           - Internal hemorrhoids.                           - Melanosis in the colon.                           - The examination was otherwise normal on direct                            and retroflexion views.                            - No specimens collected. Recommendation:           - Patient has a contact number available for                            emergencies. The signs and symptoms of potential                            delayed complications were discussed with the                            patient. Return to normal activities tomorrow.                            Written discharge instructions were provided to the  patient.                           - Resume previous diet.                           - Continue present medications.                           - Repeat colonoscopy in 1 year for screening                            purposes (see prep details above). GOLYTELY PREP                            FOR NEXT COLONOSCOPY. And close attention to                            preprocedure dietary restrictions regarding fibrous                            foods. Batul Diego L. Myrtie Neither, MD 08/11/2022 8:25:13 AM This report has been signed electronically.

## 2022-08-11 NOTE — Progress Notes (Signed)
Report given to PACU, vss 

## 2022-08-12 ENCOUNTER — Telehealth: Payer: Self-pay

## 2022-08-12 NOTE — Telephone Encounter (Signed)
  Follow up Call-     08/11/2022    7:21 AM 08/11/2022    7:15 AM  Call back number  Post procedure Call Back phone  # [phone]   Permission to leave phone message  Yes     Patient questions:  Do you have a fever, pain , or abdominal swelling? No. Pain Score  0 *  Have you tolerated food without any problems? Yes.    Have you been able to return to your normal activities? Yes.    Do you have any questions about your discharge instructions: Diet   No. Medications  No. Follow up visit  No.  Do you have questions or concerns about your Care? No.  Actions: * If pain score is 4 or above: No action needed, pain <4.

## 2023-03-05 ENCOUNTER — Ambulatory Visit: Payer: 59 | Admitting: Internal Medicine

## 2023-03-05 NOTE — Progress Notes (Deleted)
Name: Robert Strickland  MRN/ DOB: [id number], [birth year]    Age/ Sex: 62 y.o., male    PCP: Marisue Brooklyn   Reason for Endocrinology Evaluation: Low testosterone      Date of Initial Endocrinology Evaluation: 03/05/2023     HPI: Mr. Robert Strickland is a 62 y.o. male with a past medical history of Dm, HTN, Obesity. The patient presented for initial endocrinology clinic visit on 03/05/2023 for consultative assistance with his low testosterone .   Patient was referred here for further evaluation of testosterone.  He does have chronic conditions including DM, HTN, dyslipidemia and obesity.  Patient is on Viagra for erectile dysfunction.  In reviewing his records, there has been no evaluation of testosterone levels  HISTORY:  Past Medical History:  Past Medical History:  Diagnosis Date   COPD (chronic obstructive pulmonary disease) (HCC)    Diabetes mellitus without complication (HCC)    Hyperlipidemia    Past Surgical History:  Past Surgical History:  Procedure Laterality Date   HERNIA REPAIR  2001    Social History:  reports that he has been smoking cigarettes. He has never used smokeless tobacco. He reports that he does not currently use alcohol. He reports that he does not currently use drugs after having used the following drugs: Cocaine. Family History: family history includes Colon cancer in his maternal grandmother and mother.   HOME MEDICATIONS: Allergies as of 03/05/2023       Reactions   Bee Venom Swelling   Possibly anaphylaxis per pt Possibly anaphylaxis per pt Possibly anaphylaxis per pt    Possibly anaphylaxis per pt   Varenicline Other (See Comments)   Vivid dreams Other Reaction(s): Other (See Comments)    Vivid dreams        Medication List        Accurate as of March 05, 2023  7:15 AM. If you have any questions, ask your nurse or doctor.          albuterol 108 (90 Base) MCG/ACT inhaler Commonly known as: VENTOLIN HFA INHALE 2  PUFFS INTO THE LUNGS EVERY 4 HOURS AS NEEDED FOR WHEEZING   amoxicillin 500 MG capsule Commonly known as: AMOXIL Take 500 mg by mouth every 6 (six) hours.   atorvastatin 20 MG tablet Commonly known as: LIPITOR Take 20 mg by mouth daily.   budesonide-formoterol 160-4.5 MCG/ACT inhaler Commonly known as: Symbicort Inhale 2 puffs into the lungs 2 (two) times daily.   buPROPion 150 MG 24 hr tablet Commonly known as: Wellbutrin XL Take 1 tablet (150 mg total) by mouth daily.   cyclobenzaprine 10 MG tablet Commonly known as: FLEXERIL TAKE 1 TABLET(10 MG) BY MOUTH AT BEDTIME AS NEEDED   diclofenac 75 MG EC tablet Commonly known as: VOLTAREN Take by mouth 2 (two) times daily.   ibuprofen 800 MG tablet Commonly known as: ADVIL Take 800 mg by mouth every 8 (eight) hours.   lisinopril 40 MG tablet Commonly known as: ZESTRIL Take 40 mg by mouth daily.   metFORMIN 500 MG tablet Commonly known as: GLUCOPHAGE TAKE 1 TABLET(500 MG) BY MOUTH DAILY WITH BREAKFAST   sildenafil 100 MG tablet Commonly known as: Viagra Take 0.5-1 tablets (50-100 mg total) by mouth daily as needed for erectile dysfunction.   Wixela Inhub 250-50 MCG/ACT Aepb Generic drug: fluticasone-salmeterol Inhale into the lungs.          REVIEW OF SYSTEMS: A comprehensive ROS was conducted with the patient and is negative  except as per HPI and below:  ROS     OBJECTIVE:  VS: There were no vitals taken for this visit.   Wt Readings from Last 3 Encounters:  08/11/22 270 lb (122.5 kg)  07/04/22 270 lb (122.5 kg)  06/10/22 267 lb (121.1 kg)     EXAM: General: Pt appears well and is in NAD  Eyes: External eye exam normal without stare, lid lag or exophthalmos.  EOM intact.  PERRL.  Neck: General: Supple without adenopathy. Thyroid: Thyroid size normal.  No goiter or nodules appreciated. No thyroid bruit.  Lungs: Clear with good BS bilat   Heart: Auscultation: RRR.  Abdomen: Soft, nontender   Extremities:  BL LE: No pretibial edema   Mental Status: Judgment, insight: Intact Orientation: Oriented to time, place, and person Mood and affect: No depression, anxiety, or agitation     DATA REVIEWED: ***    ASSESSMENT/PLAN/RECOMMENDATIONS:   ***    Medications :  Signed electronically by: Lyndle Herrlich, MD  [facility]  [facility] [address] [place], [place] [address] Phone: [phone] FAX: [phone]   CC: Marisue Brooklyn [address] [place] [address] Phone: [phone] Fax: [phone]   Return to Endocrinology clinic as below: Future Appointments  Date Time Provider Department Center  03/05/2023  9:10 AM Laney Louderback, Konrad Dolores, MD [id number] None

## 2023-07-20 ENCOUNTER — Encounter (HOSPITAL_BASED_OUTPATIENT_CLINIC_OR_DEPARTMENT_OTHER): Payer: Self-pay

## 2023-07-20 ENCOUNTER — Emergency Department (HOSPITAL_BASED_OUTPATIENT_CLINIC_OR_DEPARTMENT_OTHER)
Admission: EM | Admit: 2023-07-20 | Discharge: 2023-07-20 | Disposition: A | Discharge status: Home / Self Care | Attending: Emergency Medicine | Admitting: Emergency Medicine

## 2023-07-20 ENCOUNTER — Other Ambulatory Visit: Payer: Self-pay

## 2023-07-20 DIAGNOSIS — R310 Gross hematuria: Secondary | ICD-10-CM | POA: Insufficient documentation

## 2023-07-20 DIAGNOSIS — R319 Hematuria, unspecified: Secondary | ICD-10-CM | POA: Diagnosis present

## 2023-07-20 LAB — URINALYSIS, ROUTINE W REFLEX MICROSCOPIC

## 2023-07-20 LAB — URINALYSIS, MICROSCOPIC (REFLEX): RBC / HPF: >50 RBC/hpf (ref 0–5)

## 2023-07-20 MED ORDER — SULFAMETHOXAZOLE-TRIMETHOPRIM 800-160 MG PO TABS: 1.0000 | ORAL_TABLET | Freq: Two times a day (BID) | ORAL | 0 refills | Status: AC

## 2023-07-20 NOTE — ED Provider Notes (Signed)
 [place] [facility] Provider Note   CSN: [id number] Arrival date & time: 07/20/23  1227     History  Chief Complaint  Patient presents with   Hematuria    Robert Strickland is a 63 y.o. male.  63 yo M with a chief complaints of hematuria.  This has been going on since earlier today.  He has had this happen to him in the past but was less bloody.  He saw urologist at that time and sounds like he had a thorough workup.  He said it was very uncomfortable and does not plan on doing that again.  He denies fevers denies flank pain feels like he can only urinate without issue.   Hematuria       Home Medications Prior to Admission medications   Medication Sig Start Date End Date Taking? Authorizing Provider  sulfamethoxazole-trimethoprim (BACTRIM DS) 800-160 MG tablet Take 1 tablet by mouth 2 (two) times daily for 7 days. 07/20/23 07/27/23 Yes Melene Plan, DO  albuterol (VENTOLIN HFA) 108 (90 Base) MCG/ACT inhaler INHALE 2 PUFFS INTO THE LUNGS EVERY 4 HOURS AS NEEDED FOR WHEEZING 05/24/19   [provider]  amoxicillin (AMOXIL) 500 MG capsule Take 500 mg by mouth every 6 (six) hours. 02/05/22   [provider]  atorvastatin (LIPITOR) 20 MG tablet Take 20 mg by mouth daily.    [provider]  budesonide-formoterol (SYMBICORT) 160-4.5 MCG/ACT inhaler Inhale 2 puffs into the lungs 2 (two) times daily. 05/27/22   Saguier, Ramon Dredge, PA-C  buPROPion (WELLBUTRIN XL) 150 MG 24 hr tablet Take 1 tablet (150 mg total) by mouth daily. 05/27/22   Saguier, Ramon Dredge, PA-C  cyclobenzaprine (FLEXERIL) 10 MG tablet TAKE 1 TABLET(10 MG) BY MOUTH AT BEDTIME AS NEEDED 11/07/16   [provider]  diclofenac (VOLTAREN) 75 MG EC tablet Take by mouth 2 (two) times daily.    [provider]  ibuprofen (ADVIL) 800 MG tablet Take 800 mg by mouth every 8 (eight) hours. Patient not taking: Reported on 07/04/2022 02/27/22   [provider]   lisinopril (ZESTRIL) 40 MG tablet Take 40 mg by mouth daily. 03/06/21   [provider]  metFORMIN (GLUCOPHAGE) 500 MG tablet TAKE 1 TABLET(500 MG) BY MOUTH DAILY WITH BREAKFAST 08/19/17   [provider]  sildenafil (VIAGRA) 100 MG tablet Take 0.5-1 tablets (50-100 mg total) by mouth daily as needed for erectile dysfunction. 05/27/22   Saguier, Ramon Dredge, PA-C  WIXELA INHUB 250-50 MCG/ACT AEPB Inhale into the lungs. 03/17/22 09/13/22  [provider]      Allergies    Bee venom and Varenicline    Review of Systems   Review of Systems  Genitourinary:  Positive for hematuria.    Physical Exam Updated Vital Signs BP (!) 186/91   Pulse 99   Temp 98.4 F (36.9 C) (Oral)   Resp 16   Ht 5\' 5"  (1.651 m)   Wt 122.5 kg   SpO2 95%   BMI 44.93 kg/m  Physical Exam Vitals and nursing note reviewed.  Constitutional:      Appearance: He is well-developed.  HENT:     Head: Normocephalic and atraumatic.  Eyes:     Pupils: Pupils are equal, round, and reactive to light.  Neck:     Vascular: No JVD.  Cardiovascular:     Rate and Rhythm: Normal rate and regular rhythm.     Heart sounds: No murmur heard.    No friction  rub. No gallop.  Pulmonary:     Effort: No respiratory distress.     Breath sounds: No wheezing.  Abdominal:     General: There is no distension.     Tenderness: There is no abdominal tenderness. There is no right CVA tenderness, left CVA tenderness, guarding or rebound.  Musculoskeletal:        General: Normal range of motion.     Cervical back: Normal range of motion and neck supple.  Skin:    Coloration: Skin is not pale.     Findings: No rash.  Neurological:     Mental Status: He is alert and oriented to person, place, and time.  Psychiatric:        Behavior: Behavior normal.     ED Results / Procedures / Treatments   Labs (all labs ordered are listed, but only abnormal results are displayed) Labs Reviewed  URINALYSIS, ROUTINE W  REFLEX MICROSCOPIC - Abnormal; Notable for the following components:      Result Value   Color, Urine RED (*)    APPearance TURBID (*)    Glucose, UA   (*)    Value: TEST NOT REPORTED DUE TO COLOR INTERFERENCE OF URINE PIGMENT   Hgb urine dipstick   (*)    Value: TEST NOT REPORTED DUE TO COLOR INTERFERENCE OF URINE PIGMENT   Bilirubin Urine   (*)    Value: TEST NOT REPORTED DUE TO COLOR INTERFERENCE OF URINE PIGMENT   Ketones, ur   (*)    Value: TEST NOT REPORTED DUE TO COLOR INTERFERENCE OF URINE PIGMENT   Protein, ur   (*)    Value: TEST NOT REPORTED DUE TO COLOR INTERFERENCE OF URINE PIGMENT   Nitrite   (*)    Value: TEST NOT REPORTED DUE TO COLOR INTERFERENCE OF URINE PIGMENT   Leukocytes,Ua   (*)    Value: TEST NOT REPORTED DUE TO COLOR INTERFERENCE OF URINE PIGMENT   All other components within normal limits  URINALYSIS, MICROSCOPIC (REFLEX) - Abnormal; Notable for the following components:   Bacteria, UA FEW (*)    All other components within normal limits    EKG None  Radiology No results found.  Procedures Procedures    Medications Ordered in ED Medications - No data to display  ED Course/ Medical Decision Making/ A&P                                 Medical Decision Making Amount and/or Complexity of Data Reviewed Labs: ordered.  Risk Prescription drug management.   63 yo M with a chief complaints of hematuria.  Started just about an hour ago.  No pain with this.  No fevers and vomiting.  He has had something like this happen in the past and has seen urology it sounds like he had a full workup for it.  He denies flank pain.  Will give information for urology.  Does not appear to be retaining.  UA with few bacteria.  Short course of antibiotics.  Urology follow-up.  2:11 PM:  I have discussed the diagnosis/risks/treatment options with the patient and family.  Evaluation and diagnostic testing in the emergency department does not suggest an emergent  condition requiring admission or immediate intervention beyond what has been performed at this time.  They will follow up with Urology. We also discussed returning to the ED immediately if new or worsening sx occur. We discussed  the sx which are most concerning (e.g., sudden worsening pain, fever, inability to tolerate by mouth, inability to urinate, flank pain) that necessitate immediate return. Medications administered to the patient during their visit and any new prescriptions provided to the patient are listed below.  Medications given during this visit Medications - No data to display   The patient appears reasonably screen and/or stabilized for discharge and I doubt any other medical condition or other [facility] requiring further screening, evaluation, or treatment in the ED at this time prior to discharge.          Final Clinical Impression(s) / ED Diagnoses Final diagnoses:  Gross hematuria    Rx / DC Orders ED Discharge Orders          Ordered    sulfamethoxazole-trimethoprim (BACTRIM DS) 800-160 MG tablet  2 times daily        07/20/23 1359              Melene Plan, DO 07/20/23 1411

## 2023-07-20 NOTE — Discharge Instructions (Addendum)
 Follow up with your urologist in the office.  I have given you information for our urology office if you would like this evaluated.  Please return if you are having difficulty urinating.

## 2023-07-20 NOTE — ED Triage Notes (Signed)
 Pt stats that he went to use restroom and noted blood in urine. States it was dark red blood.

## 2023-08-27 ENCOUNTER — Encounter: Admitting: Family Medicine

## 2023-09-18 ENCOUNTER — Other Ambulatory Visit: Payer: Self-pay | Admitting: Medical

## 2023-12-23 ENCOUNTER — Other Ambulatory Visit: Payer: Self-pay

## 2023-12-23 ENCOUNTER — Emergency Department (HOSPITAL_BASED_OUTPATIENT_CLINIC_OR_DEPARTMENT_OTHER)
Admission: EM | Admit: 2023-12-23 | Discharge: 2023-12-23 | Disposition: A | Discharge status: Home / Self Care | Attending: Emergency Medicine | Admitting: Emergency Medicine

## 2023-12-23 DIAGNOSIS — M25562 Pain in left knee: Secondary | ICD-10-CM | POA: Insufficient documentation

## 2023-12-23 MED ORDER — COLCHICINE 0.6 MG PO TABS: ORAL_TABLET | ORAL | 0 refills | Status: AC

## 2023-12-23 MED ORDER — KETOROLAC TROMETHAMINE 15 MG/ML IJ SOLN
15.0000 mg | Freq: Once | INTRAMUSCULAR | Status: AC
  Administered 2023-12-23: 15 mg via INTRAMUSCULAR
  Filled 2023-12-23: qty 1

## 2023-12-23 MED ORDER — MORPHINE SULFATE 15 MG PO TABS
7.5000 mg | ORAL_TABLET | ORAL | 0 refills | Status: AC | PRN
Start: 2023-12-23 — End: ?

## 2023-12-23 NOTE — Discharge Instructions (Signed)
 Please return if you have worsening pain fever rapid spreading redness. Take 4 over the counter ibuprofen tablets 3 times a day or 2 over-the-counter naproxen tablets twice a day for pain. Also take tylenol 1000mg (2 extra strength) four times a day.   Then take the pain medicine if you feel like you need it. Narcotics do not help with the pain, they only make you care about it less.  You can become addicted to this, people may break into your house to steal it.  It will constipate you.  If you drive under the influence of this medicine you can get a DUI.

## 2023-12-23 NOTE — ED Provider Notes (Signed)
 [place] [facility] Provider Note   CSN: [id number] Arrival date & time: 12/23/23  9148     Patient presents with: Knee Pain   Robert Strickland is a 63 y.o. male.   63 yo M with a chief complaint of left knee pain.  Going on for about 3 days now.  Atraumatic.  Feels like his gout.  Has had gout in there before.  He tried his home medications but without improvement.   Knee Pain      Prior to Admission medications   Medication Sig Start Date End Date Taking? Authorizing Provider  colchicine  0.6 MG tablet Take two tabs by mouth and then an hour later take one tab. 12/23/23  Yes Emil Share, DO  morphine  (MSIR) 15 MG tablet Take 0.5 tablets (7.5 mg total) by mouth every 4 (four) hours as needed for severe pain (pain score 7-10). 12/23/23  Yes Emil Share, DO  albuterol (VENTOLIN HFA) 108 (90 Base) MCG/ACT inhaler INHALE 2 PUFFS INTO THE LUNGS EVERY 4 HOURS AS NEEDED FOR WHEEZING 05/24/19   [provider]  amoxicillin (AMOXIL) 500 MG capsule Take 500 mg by mouth every 6 (six) hours. 02/05/22   [provider]  atorvastatin (LIPITOR) 20 MG tablet Take 20 mg by mouth daily.    [provider]  budesonide -formoterol  (SYMBICORT ) 160-4.5 MCG/ACT inhaler Inhale 2 puffs into the lungs 2 (two) times daily. 05/27/22   Saguier, [place], PA-C  buPROPion  (WELLBUTRIN  XL) 150 MG 24 hr tablet Take 1 tablet (150 mg total) by mouth daily. 05/27/22   Saguier, [place], PA-C  cyclobenzaprine (FLEXERIL) 10 MG tablet TAKE 1 TABLET(10 MG) BY MOUTH AT BEDTIME AS NEEDED 11/07/16   [provider]  diclofenac (VOLTAREN) 75 MG EC tablet Take by mouth 2 (two) times daily.    [provider]  ibuprofen (ADVIL) 800 MG tablet Take 800 mg by mouth every 8 (eight) hours. Patient not taking: Reported on 07/04/2022 02/27/22   [provider]  lisinopril (ZESTRIL) 40 MG tablet Take 40 mg by mouth daily. 03/06/21   [provider]   metFORMIN (GLUCOPHAGE) 500 MG tablet TAKE 1 TABLET(500 MG) BY MOUTH DAILY WITH BREAKFAST 08/19/17   [provider]  sildenafil  (VIAGRA ) 100 MG tablet Take 0.5-1 tablets (50-100 mg total) by mouth daily as needed for erectile dysfunction. 05/27/22   Saguier, [place], PA-C  WIXELA INHUB 250-50 MCG/ACT AEPB Inhale into the lungs. 03/17/22 09/13/22  [provider]    Allergies: Bee venom and Varenicline    Review of Systems  Updated Vital Signs BP 129/81 (BP Location: Right Arm)   Pulse 96   Temp 98.1 F (36.7 C) (Oral)   Resp 18   SpO2 93%   Physical Exam Vitals and nursing note reviewed.  Constitutional:      Appearance: He is well-developed.  HENT:     Head: Normocephalic and atraumatic.  Eyes:     Pupils: Pupils are equal, round, and reactive to light.  Neck:     Vascular: No JVD.  Cardiovascular:     Rate and Rhythm: Normal rate and regular rhythm.     Heart sounds: No murmur heard.    No friction rub. No gallop.  Pulmonary:     Effort: No respiratory distress.     Breath sounds: No wheezing.  Abdominal:     General: There is no distension.     Tenderness: There is no abdominal tenderness. There is no guarding  or rebound.  Musculoskeletal:        General: Normal range of motion.     Cervical back: Normal range of motion and neck supple.     Comments: No significant effusion.  No erythema or warmth.  Able to range the knee has some discomfort.  Skin:    Coloration: Skin is not pale.     Findings: No rash.  Neurological:     Mental Status: He is alert and oriented to person, place, and time.  Psychiatric:        Behavior: Behavior normal.     (all labs ordered are listed, but only abnormal results are displayed) Labs Reviewed - No data to display  EKG: None  Radiology: No results found.   Procedures   Medications Ordered in the ED  ketorolac  (TORADOL ) 15 MG/ML injection 15 mg (has no administration in time range)                                     Medical Decision Making Risk Prescription drug management.   63 yo M with a chief complaints of left knee pain.  Going on for about 3 days now.  Feels like his typical gout flareup.  Will start him on medication.  PCP follow-up.  9:15 AM:  I have discussed the diagnosis/risks/treatment options with the patient.  Evaluation and diagnostic testing in the emergency department does not suggest an emergent condition requiring admission or immediate intervention beyond what has been performed at this time.  They will follow up with PCP. We also discussed returning to the ED immediately if new or worsening sx occur. We discussed the sx which are most concerning (e.g., sudden worsening pain, fever, inability to tolerate by mouth, redness) that necessitate immediate return. Medications administered to the patient during their visit and any new prescriptions provided to the patient are listed below.  Medications given during this visit Medications  ketorolac  (TORADOL ) 15 MG/ML injection 15 mg (has no administration in time range)     The patient appears reasonably screen and/or stabilized for discharge and I doubt any other medical condition or other [facility] requiring further screening, evaluation, or treatment in the ED at this time prior to discharge.       Final diagnoses:  Acute pain of left knee    ED Discharge Orders          Ordered    colchicine  0.6 MG tablet        12/23/23 0914    morphine  (MSIR) 15 MG tablet  Every 4 hours PRN        12/23/23 0914               Savanah Bayles, DO 12/23/23 0915

## 2023-12-23 NOTE — ED Triage Notes (Signed)
 Pt presents with left knee pain X 3 days. Report hx of gout and states he think this a a flare up. Denies any other symptoms.
